# Patient Record
Sex: Male | Born: 1980 | Race: White | Hispanic: No | Marital: Married | Smoking: Never smoker
Health system: Southern US, Community
[De-identification: ages and names within clinical notes are randomized; demographics above are authoritative.]

## PROBLEM LIST (undated history)

## (undated) DIAGNOSIS — I10 Essential (primary) hypertension: Secondary | ICD-10-CM

## (undated) DIAGNOSIS — F419 Anxiety disorder, unspecified: Secondary | ICD-10-CM

## (undated) DIAGNOSIS — H6120 Impacted cerumen, unspecified ear: Secondary | ICD-10-CM

## (undated) HISTORY — DX: Anxiety disorder, unspecified: F41.9

## (undated) HISTORY — DX: Impacted cerumen, unspecified ear: H61.20

---

## 2011-08-19 ENCOUNTER — Ambulatory Visit (INDEPENDENT_AMBULATORY_CARE_PROVIDER_SITE_OTHER): Payer: Self-pay | Admitting: Family Medicine

## 2011-08-19 ENCOUNTER — Encounter: Payer: Self-pay | Admitting: Family Medicine

## 2011-08-19 VITALS — BP 136/84 | HR 74 | Ht 67.75 in | Wt 203.0 lb

## 2011-08-19 DIAGNOSIS — Z0000 Encounter for general adult medical examination without abnormal findings: Secondary | ICD-10-CM | POA: Insufficient documentation

## 2011-08-19 DIAGNOSIS — Z23 Encounter for immunization: Secondary | ICD-10-CM

## 2011-08-19 DIAGNOSIS — E781 Pure hyperglyceridemia: Secondary | ICD-10-CM

## 2011-08-19 LAB — LIPID PANEL
Cholesterol: 235 mg/dL — ABNORMAL HIGH (ref 0–200)
Total CHOL/HDL Ratio: 5
Triglycerides: 114 mg/dL (ref 0.0–149.0)
VLDL: 22.8 mg/dL (ref 0.0–40.0)

## 2011-08-19 LAB — CBC WITH DIFFERENTIAL/PLATELET
Basophils Absolute: 0 10*3/uL (ref 0.0–0.1)
Eosinophils Absolute: 0.1 10*3/uL (ref 0.0–0.7)
Lymphocytes Relative: 32.9 % (ref 12.0–46.0)
Lymphs Abs: 2 10*3/uL (ref 0.7–4.0)
MCHC: 34.4 g/dL (ref 30.0–36.0)
Monocytes Relative: 7.5 % (ref 3.0–12.0)
Platelets: 297 10*3/uL (ref 150.0–400.0)
RDW: 12.9 % (ref 11.5–14.6)

## 2011-08-19 LAB — TSH: TSH: 0.52 u[IU]/mL (ref 0.35–5.50)

## 2011-08-19 MED ORDER — TETANUS-DIPHTH-ACELL PERTUSSIS 5-2.5-18.5 LF-MCG/0.5 IM SUSP: 0.5000 mL | Freq: Once | INTRAMUSCULAR | Status: DC

## 2011-08-19 NOTE — Assessment & Plan Note (Addendum)
Overweight is his primary issue, so we discussed getting back into exercising and start prudent dietary choices--make sustainable changes. We'll recheck lipid panel today (he is fasting this time), plus get TSH and CBC. Discussed routine health issues, vaccinations reviewed--gave TdaP today. Next CPE one year, possible earlier f/u based on lab results.

## 2011-08-19 NOTE — Progress Notes (Signed)
Office Note 08/19/2011  CC:  Chief Complaint  Patient presents with  . Annual Exam    elevated cholesterol    HPI:  Nicholas Compton is a 30 y.o. White male who is here to establish care and get CPE. Patient's most recent primary MD: none Old records were not reviewed prior to or during today's visit.  No acute physical complaints.  Says he had blood work for insurance reasons 06/2011 and his triglycerides were 536, HDL 34, Tchol 280. He was not fasting.  Other labs at that time were normal (complete metabolic panel, HIV, and UA).  Past Medical History  Diagnosis Date  . Elevated blood pressure reading without diagnosis of hypertension     Took low dose of bp med for about 1 yr and weened off when he had 50+ lb wt (purposeful ).  . Cerumen impaction     Recurrent: occasionally goes to ENT to get them cleaned out.  Marland Kitchen Anxiety     Took citalopram (Dr. Drue Second at [facility]) for about 1 yr for GAD and weened off it and has been doing fine since.    History reviewed. No pertinent past surgical history.  Family History  Problem Relation Age of Onset  . Hyperlipidemia Paternal Aunt   . Cancer Maternal Grandmother     breast  . Hyperlipidemia Paternal Grandfather   . Diabetes Paternal Grandfather     History   Social History  . Marital Status: Married    Spouse Name: Drinda Butts    Number of Children: 0  . Years of Education: N/A   Occupational History  . Not on file.   Social History Main Topics  . Smoking status: Never Smoker   . Smokeless tobacco: Never Used  . Alcohol Use: No  . Drug Use: No  . Sexually Active: Yes -- Male partner(s)   Other Topics Concern  . Not on file   Social History Narrative   Married (2 months ago).  No children.Works in his Dispensing optician business, originally from [place], [place].  Went to [employer].  Currently does no formal exercise except occasionally in winter.  Diet is typical american--fast foods too  much, fried/greasy foods.  No T/A/Ds.   MEDS: NONE No outpatient encounter prescriptions on file as of 08/19/2011.   Facility-Administered Encounter Medications as of 08/19/2011  Medication Dose Route Frequency Provider Last Rate Last Dose  . TDaP (BOOSTRIX) injection 0.5 mL  0.5 mL Intramuscular Once Philip H McGowen        No Known Allergies  ROS Review of Systems  Constitutional: Negative for fever, chills, appetite change and fatigue.  HENT: Negative for ear pain, congestion, sore throat, neck stiffness and dental problem.   Eyes: Negative for discharge, redness and visual disturbance.  Respiratory: Negative for cough, chest tightness, shortness of breath and wheezing.   Cardiovascular: Negative for chest pain, palpitations and leg swelling.  Gastrointestinal: Negative for nausea, vomiting, abdominal pain, diarrhea and blood in stool.  Genitourinary: Negative for dysuria, urgency, frequency, hematuria, flank pain and difficulty urinating.  Musculoskeletal: Negative for myalgias, back pain, joint swelling and arthralgias.  Skin: Negative for pallor and rash.  Neurological: Negative for dizziness, speech difficulty, weakness and headaches.  Hematological: Negative for adenopathy. Does not bruise/bleed easily.  Psychiatric/Behavioral: Negative for confusion and sleep disturbance. The patient is not nervous/anxious.     PE; Blood pressure 136/84, pulse 74, height 5' 7.75" (1.721 m), weight 203 lb (92.08 kg), SpO2 98.00%. Gen:  Alert, well appearing.  Patient is oriented to person, place, time, and situation. HEENT: Scalp without lesions or hair loss.  Ears: EACs full of dried yellowish cerumen bilaterally.  Cannot view TMs. Eyes: no injection, icteris, swelling, or exudate.  EOMI, PERRLA. Nose: no drainage or turbinate edema/swelling.  No injection or focal lesion.  Mouth: lips without lesion/swelling.  Oral mucosa pink and moist.  Some dental erosions present, multiple fillings.  Oropharynx without erythema, exudate, or swelling.  Neck: supple, ROM full.  Carotids 2+ bilat, without bruit.  No lymphadenopathy, thyromegaly, or mass. Chest: symmetric expansion, nonlabored respirations.  Clear and equal breath sounds in all lung fields.   CV: RRR, no m/r/g.  Peripheral pulses 2+ and symmetric. ABD: soft, NT, ND, BS normal.  No hepatospenomegaly or mass.  No bruits. EXT: no clubbing, cyanosis, or edema.  Genitals normal; both testes normal without tenderness, masses, hydroceles, varicoceles, erythema or swelling. Shaft normal, uncircumcised, meatus normal without discharge. No inguinal hernia noted. No inguinal lymphadenopathy.   Pertinent labs:  None today  ASSESSMENT AND PLAN:   Health maintenance examination Overweight is his primary issue, so we discussed getting back into exercising and start prudent dietary choices--make sustainable changes. We'll recheck lipid panel today (he is fasting this time), plus get TSH and CBC. Discussed routine health issues, vaccinations reviewed--gave TdaP today. Next CPE one year, possible earlier f/u based on lab results.     Return in about 1 year (around 08/18/2012) for for CPE.

## 2011-08-19 NOTE — Patient Instructions (Signed)
Health Maintenance in Males MAINTAIN REGULAR HEALTH EXAMS  Maintain a healthy diet and normal weight. Increased weight leads to problems with blood pressure and diabetes. Decrease fat in the diet and increase exercise. Obtain a proper diet from your caregiver if necessary.   High blood pressure causes heart and blood vessel problems. Check blood pressures regularly and keep your blood pressure at normal limits. Aerobic exercise helps this. Persistent elevations of blood pressure should be treated with medications if weight loss and exercise are ineffective.   Avoid smoking, drinking in excess (more than 2 drinks per day), or use of street drugs. Do not share needles with anyone. Ask for help if you need assistance or instructions on stopping the use of alcohol, cigarettes, or drugs.   Maintain normal blood lipids and cholesterol. Your caregiver can give you information to lower your risk of heart disease or stroke.   Ask your caregiver if you are in need of early heart disease screening because of a strong family history of heart disease or signs of elevated testosterone (male sex hormone) levels. These can predispose you to early heart disease.   Practice safe sex. Practicing safe sex decreases your risk for a sexually transmitted infection (STI). Some of the STIs are gonorrhea, chlamydia, syphilis, trichimonas, herpes, human papillomavirus (HPV), and human immunodeficiency virus (HIV). Herpes, HIV, and HPV are viral illnesses that have no cure. These can result in disability, cancer, and death.   It is not safe for someone who has AIDS or is HIV positive to have unprotected sex with a partner who is HIV positive. The reason for this is the fact that there are many different strains of HIV. If you have a strain that is readily treated with medications and then suddenly introduce a strain from a partner that has no further treatment options, you may suddenly have a strain of HIV that is untreatable.  Even if you are both positive for HIV, it is still necessary to practice safe sex.   Use sunscreen with a SPF of 15 or greater. Being outside in the sun when your shadow caused by the sun is shorter than you are, means you are being exposed to sun at greater intensity. Lighter skinned people are at a greater risk of skin cancer.   Keep carbon monoxide and smoke detectors in your home and functioning at all times. Change the batteries every 6 months.   Do monthly examinations of your testicles. The best time to do this is after a hot shower or bath when the tissues are loose. Notify your caregivers of any lumps, tenderness, or changes in size or shape.   Notify your caregiver of new moles or changes in moles, especially if there is a change in shape or color. Also notify your caregiver if a mole is larger than the size of a pencil eraser.   Stay current with your tetanus shots and other required immunizations.  The Body Mass Index (BMI) is a way of measuring how much of your body is fat. Having a BMI above 27 increases the risk of heart disease, diabetes, hypertension, stroke, and other problems related to obesity. Document Released: 06/02/2008 Document Re-Released: 05/25/2010 ExitCare Patient Information 2011 [employer]. 

## 2011-08-25 ENCOUNTER — Telehealth: Payer: Self-pay | Admitting: Family Medicine

## 2011-08-25 NOTE — Telephone Encounter (Signed)
Patient's wife didn't call with fax # so patient took the lab results with him

## 2011-08-25 NOTE — Telephone Encounter (Signed)
Labs printed and will be faxed by Diane.

## 2011-08-25 NOTE — Telephone Encounter (Signed)
Patient needs recent labs faxed to [facility], patient is coming by this afternoon to sign a release form, wife will CB with fax #

## 2012-03-08 ENCOUNTER — Telehealth: Payer: Self-pay | Admitting: Family Medicine

## 2012-03-09 NOTE — Telephone Encounter (Signed)
Please advise 

## 2012-03-15 MED ORDER — SCOPOLAMINE 1 MG/3DAYS TD PT72: MEDICATED_PATCH | TRANSDERMAL | Status: DC

## 2012-03-15 NOTE — Telephone Encounter (Signed)
Sent rx to [facility] outpt pharmacy just now.---PM

## 2012-03-19 NOTE — Telephone Encounter (Signed)
Left mess on patient's cell VM

## 2015-04-08 ENCOUNTER — Emergency Department (HOSPITAL_BASED_OUTPATIENT_CLINIC_OR_DEPARTMENT_OTHER)
Admission: EM | Admit: 2015-04-08 | Discharge: 2015-04-08 | Disposition: A | Discharge status: Home / Self Care | Payer: BLUE CROSS/BLUE SHIELD | Attending: Emergency Medicine | Admitting: Emergency Medicine

## 2015-04-08 ENCOUNTER — Encounter (HOSPITAL_BASED_OUTPATIENT_CLINIC_OR_DEPARTMENT_OTHER): Payer: Self-pay | Admitting: *Deleted

## 2015-04-08 ENCOUNTER — Emergency Department (HOSPITAL_BASED_OUTPATIENT_CLINIC_OR_DEPARTMENT_OTHER): Payer: BLUE CROSS/BLUE SHIELD

## 2015-04-08 ENCOUNTER — Other Ambulatory Visit: Payer: Self-pay

## 2015-04-08 DIAGNOSIS — I10 Essential (primary) hypertension: Secondary | ICD-10-CM | POA: Insufficient documentation

## 2015-04-08 DIAGNOSIS — Z791 Long term (current) use of non-steroidal anti-inflammatories (NSAID): Secondary | ICD-10-CM | POA: Diagnosis not present

## 2015-04-08 DIAGNOSIS — R0789 Other chest pain: Secondary | ICD-10-CM | POA: Insufficient documentation

## 2015-04-08 DIAGNOSIS — Z8669 Personal history of other diseases of the nervous system and sense organs: Secondary | ICD-10-CM | POA: Insufficient documentation

## 2015-04-08 DIAGNOSIS — R0600 Dyspnea, unspecified: Secondary | ICD-10-CM | POA: Insufficient documentation

## 2015-04-08 DIAGNOSIS — Z8659 Personal history of other mental and behavioral disorders: Secondary | ICD-10-CM | POA: Diagnosis not present

## 2015-04-08 DIAGNOSIS — R0602 Shortness of breath: Secondary | ICD-10-CM | POA: Diagnosis present

## 2015-04-08 HISTORY — DX: Essential (primary) hypertension: I10

## 2015-04-08 LAB — CBC WITH DIFFERENTIAL/PLATELET
BASOS PCT: 1 % (ref 0–1)
Basophils Absolute: 0 10*3/uL (ref 0.0–0.1)
EOS ABS: 0.1 10*3/uL (ref 0.0–0.7)
Eosinophils Relative: 1 % (ref 0–5)
HEMATOCRIT: 49 % (ref 39.0–52.0)
Hemoglobin: 17.6 g/dL — ABNORMAL HIGH (ref 13.0–17.0)
LYMPHS ABS: 2.4 10*3/uL (ref 0.7–4.0)
Lymphocytes Relative: 30 % (ref 12–46)
MCH: 30.3 pg (ref 26.0–34.0)
MCHC: 35.9 g/dL (ref 30.0–36.0)
MCV: 84.5 fL (ref 78.0–100.0)
MONO ABS: 0.7 10*3/uL (ref 0.1–1.0)
MONOS PCT: 9 % (ref 3–12)
Neutro Abs: 4.6 10*3/uL (ref 1.7–7.7)
Neutrophils Relative %: 59 % (ref 43–77)
Platelets: 316 10*3/uL (ref 150–400)
RBC: 5.8 MIL/uL (ref 4.22–5.81)
RDW: 12.7 % (ref 11.5–15.5)
WBC: 7.9 10*3/uL (ref 4.0–10.5)

## 2015-04-08 LAB — BASIC METABOLIC PANEL
ANION GAP: 8 (ref 5–15)
BUN: 17 mg/dL (ref 6–23)
CALCIUM: 10.1 mg/dL (ref 8.4–10.5)
CHLORIDE: 102 mmol/L (ref 96–112)
CO2: 27 mmol/L (ref 19–32)
Creatinine, Ser: 0.98 mg/dL (ref 0.50–1.35)
GFR calc Af Amer: >90 mL/min (ref >90)
GFR calc non Af Amer: >90 mL/min (ref >90)
Glucose, Bld: 114 mg/dL — ABNORMAL HIGH (ref 70–99)
Potassium: 4 mmol/L (ref 3.5–5.1)
Sodium: 137 mmol/L (ref 135–145)

## 2015-04-08 LAB — TROPONIN I

## 2015-04-08 MED ORDER — NAPROXEN 500 MG PO TABS: 500.0000 mg | ORAL_TABLET | Freq: Two times a day (BID) | ORAL | Status: AC

## 2015-04-08 NOTE — ED Provider Notes (Signed)
CSN: [id number]     Arrival date & time 04/08/15  6295 History   First MD Initiated Contact with Patient 04/08/15 0913     Chief Complaint  Patient presents with  . Shortness of Breath     (Consider location/radiation/quality/duration/timing/severity/associated sxs/prior Treatment) HPI Comments: Patient presents with 5-6 days of intermittent shortness of breath and intermittent sharp migratory chest pains lasting seconds at a time.  He states his felt fatigued.  He occasionally has had tingling in his fingertips and 1-2 episodes of lightheadedness.  He denies fevers, chills, coughing, leg pain or swelling.  No sick contacts.  No known past medical history.  No family history of premature coronary disease.  No smoking, drug use.  No aggravating or alleviating factors.  2.  Shortness of breath or chest pain.  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  Symptoms are not exacerbated by exertion  Patient is a 34 y.o. male presenting with shortness of breath.  Shortness of Breath Associated symptoms: no abdominal pain, no chest pain, no cough, no fever, no headaches, no rash and no vomiting     Past Medical History  Diagnosis Date  . Elevated blood pressure reading without diagnosis of hypertension     Took low dose of bp med for about 1 yr and weened off when he had 50+ lb wt (purposeful ).  . Cerumen impaction     Recurrent: occasionally goes to ENT to get them cleaned out.  Marland Kitchen Anxiety     Took citalopram (Dr. Drue Second at [facility]) for about 1 yr for GAD and weened off it and has been doing fine since.  . Hypertension    History reviewed. No pertinent past surgical history. Family History  Problem Relation Age of Onset  . Hyperlipidemia Paternal Aunt   . Cancer Maternal Grandmother     breast  . Hyperlipidemia Paternal Grandfather   . Diabetes  Paternal Grandfather    History  Substance Use Topics  . Smoking status: Never Smoker   . Smokeless tobacco: Never Used  . Alcohol Use: No    Review of Systems  Constitutional: Negative for fever, activity change, appetite change and fatigue.  HENT: Negative for congestion, facial swelling, rhinorrhea and trouble swallowing.   Eyes: Negative for photophobia and pain.  Respiratory: Positive for chest tightness and shortness of breath. Negative for cough.   Cardiovascular: Negative for chest pain and leg swelling.  Gastrointestinal: Negative for nausea, vomiting, abdominal pain, diarrhea and constipation.  Endocrine: Negative for polydipsia and polyuria.  Genitourinary: Negative for dysuria, urgency, decreased urine volume and difficulty urinating.  Musculoskeletal: Negative for back pain and gait problem.  Skin: Negative for color change, rash and wound.  Allergic/Immunologic: Negative for immunocompromised state.  Neurological: Negative for dizziness, facial asymmetry, speech difficulty, weakness, numbness and headaches.  Psychiatric/Behavioral: Negative for confusion, decreased concentration and agitation.      Allergies  Review of patient's allergies indicates no known allergies.  Home Medications   Prior to Admission medications   Medication Sig Start Date End Date Taking? Authorizing Provider  naproxen (NAPROSYN) 500 MG tablet Take 1 tablet (500 mg total) by mouth 2 (two) times daily with a meal. 04/08/15   Toy Cookey, MD   BP 151/114 mmHg  Pulse 76  Temp(Src) 98.1 F (36.7 C) (Oral)  Resp 20  Ht  (1.778 m)  Wt  210 lb (95.255 kg)  BMI 30.13 kg/m2  SpO2 95% Physical Exam  Constitutional: He is oriented to person, place, and time. He appears well-developed and well-nourished. No distress.  HENT:  Head: Normocephalic and atraumatic.  Mouth/Throat: No oropharyngeal exudate.  Eyes: Pupils are equal, round, and reactive to light.  Neck: Normal range of  motion. Neck supple.  Cardiovascular: Normal rate, regular rhythm and normal heart sounds.  Exam reveals no gallop and no friction rub.   No murmur heard. Pulmonary/Chest: Effort normal and breath sounds normal. No respiratory distress. He has no wheezes. He has no rales.  Abdominal: Soft. Bowel sounds are normal. He exhibits no distension and no mass. There is no tenderness. There is no rebound and no guarding.  Musculoskeletal: Normal range of motion. He exhibits no edema or tenderness.  Neurological: He is alert and oriented to person, place, and time.  Skin: Skin is warm and dry.  Psychiatric: He has a normal mood and affect.    ED Course  Procedures (including critical care time) Labs Review Labs Reviewed  CBC WITH DIFFERENTIAL/PLATELET - Abnormal; Notable for the following:    Hemoglobin 17.6 (*)    All other components within normal limits  BASIC METABOLIC PANEL - Abnormal; Notable for the following:    Glucose, Bld 114 (*)    All other components within normal limits  TROPONIN I    Imaging Review Dg Chest 2 View  04/08/2015   CLINICAL DATA:  Shortness of breath for 3 days.  EXAM: CHEST  2 VIEW  COMPARISON:  None.  FINDINGS: The heart size and mediastinal contours are within normal limits. Both lungs are clear. The visualized skeletal structures are unremarkable.  IMPRESSION: Normal exam.   Electronically Signed   By: Francene BoyersJames  Maxwell M.D.   On: 04/08/2015 08:57     EKG Interpretation   Date/Time:  Wednesday April 08 2015 08:04:32 EDT Ventricular Rate:  85 PR Interval:  136 QRS Duration: 106 QT Interval:  384 QTC Calculation: 456 R Axis:   58 Text Interpretation:  Normal sinus rhythm Normal ECG No prior for  comparison Confirmed by Durinda Buzzelli  MD, Antwuan Eckley ([id number]) on 04/08/2015 8:13:22 AM      MDM   Final diagnoses:  Dyspnea  Atypical chest pain    Pt is a 34 y.o. male with Pmhx as above who presents with her to 6 days of episodic dyspnea and migratory sharp chest  pains.  EKG normal.  Chest x-ray normal.  Troponin is negative. TIMI score is 0, PERC score negative.  Suspect symptoms are musculoskeletal or possibly stress related.  I feel ACS and PE are unlikely.  Patient will be started on scheduled NSAIDs and asked to follow-up with PCP     Selinda MichaelsBrandon D Azam evaluation in the Emergency Department is complete. It has been determined that no acute conditions requiring further emergency intervention are present at this time. The patient/guardian have been advised of the diagnosis and plan. We have discussed signs and symptoms that warrant return to the ED, such as changes or worsening in symptoms, worsening pain, fever, worsening shortness of breath      Toy CookeyMegan Calob Baskette, MD 04/08/15 [phone]

## 2015-04-08 NOTE — ED Notes (Addendum)
C/o sob and chest feels tight. Fingers feel tingly. Onset Saturday. No n/v.

## 2015-04-08 NOTE — Discharge Instructions (Signed)
Chest Pain (Nonspecific) °It is often hard to give a specific diagnosis for the cause of chest pain. There is always a chance that your pain could be related to something serious, such as a heart attack or a blood clot in the lungs. You need to follow up with your health care provider for further evaluation. °CAUSES  °· Heartburn. °· Pneumonia or bronchitis. °· Anxiety or stress. °· Inflammation around your heart (pericarditis) or lung (pleuritis or pleurisy). °· A blood clot in the lung. °· A collapsed lung (pneumothorax). It can develop suddenly on its own (spontaneous pneumothorax) or from trauma to the chest. °· Shingles infection (herpes zoster virus). °The chest wall is composed of bones, muscles, and cartilage. Any of these can be the source of the pain. °· The bones can be bruised by injury. °· The muscles or cartilage can be strained by coughing or overwork. °· The cartilage can be affected by inflammation and become sore (costochondritis). °DIAGNOSIS  °Lab tests or other studies may be needed to find the cause of your pain. Your health care provider may have you take a test called an ambulatory electrocardiogram (ECG). An ECG records your heartbeat patterns over a 24-hour period. You may also have other tests, such as: °· Transthoracic echocardiogram (TTE). During echocardiography, sound waves are used to evaluate how blood flows through your heart. °· Transesophageal echocardiogram (TEE). °· Cardiac monitoring. This allows your health care provider to monitor your heart rate and rhythm in real time. °· Holter monitor. This is a portable device that records your heartbeat and can help diagnose heart arrhythmias. It allows your health care provider to track your heart activity for several days, if needed. °· Stress tests by exercise or by giving medicine that makes the heart beat faster. °TREATMENT  °· Treatment depends on what may be causing your chest pain. Treatment may include: °· Acid blockers for  heartburn. °· Anti-inflammatory medicine. °· Pain medicine for inflammatory conditions. °· Antibiotics if an infection is present. °· You may be advised to change lifestyle habits. This includes stopping smoking and avoiding alcohol, caffeine, and chocolate. °· You may be advised to keep your head raised (elevated) when sleeping. This reduces the chance of acid going backward from your stomach into your esophagus. °Most of the time, nonspecific chest pain will improve within 2-3 days with rest and mild pain medicine.  °HOME CARE INSTRUCTIONS  °· If antibiotics were prescribed, take them as directed. Finish them even if you start to feel better. °· For the next few days, avoid physical activities that bring on chest pain. Continue physical activities as directed. °· Do not use any tobacco products, including cigarettes, chewing tobacco, or electronic cigarettes. °· Avoid drinking alcohol. °· Only take medicine as directed by your health care provider. °· Follow your health care provider's suggestions for further testing if your chest pain does not go away. °· Keep any follow-up appointments you made. If you do not go to an appointment, you could develop lasting (chronic) problems with pain. If there is any problem keeping an appointment, call to reschedule. °SEEK MEDICAL CARE IF:  °· Your chest pain does not go away, even after treatment. °· You have a rash with blisters on your chest. °· You have a fever. °SEEK IMMEDIATE MEDICAL CARE IF:  °· You have increased chest pain or pain that spreads to your arm, neck, jaw, back, or abdomen. °· You have shortness of breath. °· You have an increasing cough, or you cough   up blood. °· You have severe back or abdominal pain. °· You feel nauseous or vomit. °· You have severe weakness. °· You faint. °· You have chills. °This is an emergency. Do not wait to see if the pain will go away. Get medical help at once. Call your local emergency services (911 in [place] Do not drive  yourself to the hospital. °MAKE SURE YOU:  °· Understand these instructions. °· Will watch your condition. °· Will get help right away if you are not doing well or get worse. °Document Released: 09/14/2005 Document Revised: 12/10/2013 Document Reviewed: 07/10/2008 °ExitCare® Patient Information ©2015 [employer]. This information is not intended to replace advice given to you by your health care provider. Make sure you discuss any questions you have with your health care provider. °Shortness of Breath °Shortness of breath means you have trouble breathing. It could also mean that you have a medical problem. You should get immediate medical care for shortness of breath. °CAUSES  °· Not enough oxygen in the air such as with high altitudes or a smoke-filled room. °· Certain lung diseases, infections, or problems. °· Heart disease or conditions, such as angina or heart failure. °· Low red blood cells (anemia). °· Poor physical fitness, which can cause shortness of breath when you exercise. °· Chest or back injuries or stiffness. °· Being overweight. °· Smoking. °· Anxiety, which can make you feel like you are not getting enough air. °DIAGNOSIS  °Serious medical problems can often be found during your physical exam. Tests may also be done to determine why you are having shortness of breath. Tests may include: °· Chest X-rays. °· Lung function tests. °· Blood tests. °· An electrocardiogram (ECG). °· An ambulatory electrocardiogram. An ambulatory ECG records your heartbeat patterns over a 24-hour period. °· Exercise testing. °· A transthoracic echocardiogram (TTE). During echocardiography, sound waves are used to evaluate how blood flows through your heart. °· A transesophageal echocardiogram (TEE). °· Imaging scans. °Your health care provider may not be able to find a cause for your shortness of breath after your exam. In this case, it is important to have a follow-up exam with your health care provider as directed.    °TREATMENT  °Treatment for shortness of breath depends on the cause of your symptoms and can vary greatly. °HOME CARE INSTRUCTIONS  °· Do not smoke. Smoking is a common cause of shortness of breath. If you smoke, ask for help to quit. °· Avoid being around chemicals or things that may bother your breathing, such as paint fumes and dust. °· Rest as needed. Slowly resume your usual activities. °· If medicines were prescribed, take them as directed for the full length of time directed. This includes oxygen and any inhaled medicines. °· Keep all follow-up appointments as directed by your health care provider. °SEEK MEDICAL CARE IF:  °· Your condition does not improve in the time expected. °· You have a hard time doing your normal activities even with rest. °· You have any new symptoms. °SEEK IMMEDIATE MEDICAL CARE IF:  °· Your shortness of breath gets worse. °· You feel light-headed, faint, or develop a cough not controlled with medicines. °· You start coughing up blood. °· You have pain with breathing. °· You have chest pain or pain in your arms, shoulders, or abdomen. °· You have a fever. °· You are unable to walk up stairs or exercise the way you normally do. °MAKE SURE YOU: °· Understand these instructions. °· Will watch your condition. °·   Will get help right away if you are not doing well or get worse. °Document Released: 08/30/2001 Document Revised: 12/10/2013 Document Reviewed: 02/20/2012 °ExitCare® Patient Information ©2015 [employer]. This information is not intended to replace advice given to you by your health care provider. Make sure you discuss any questions you have with your health care provider. ° °

## 2015-04-09 ENCOUNTER — Ambulatory Visit: Payer: BLUE CROSS/BLUE SHIELD | Admitting: Family Medicine

## 2015-04-10 ENCOUNTER — Ambulatory Visit (INDEPENDENT_AMBULATORY_CARE_PROVIDER_SITE_OTHER): Payer: BLUE CROSS/BLUE SHIELD | Admitting: Family Medicine

## 2015-04-10 ENCOUNTER — Encounter: Payer: Self-pay | Admitting: Family Medicine

## 2015-04-10 VITALS — BP 132/84 | HR 80 | Temp 99.0°F | Resp 18 | Ht 68.0 in | Wt 209.0 lb

## 2015-04-10 DIAGNOSIS — R7301 Impaired fasting glucose: Secondary | ICD-10-CM | POA: Insufficient documentation

## 2015-04-10 DIAGNOSIS — E785 Hyperlipidemia, unspecified: Secondary | ICD-10-CM | POA: Insufficient documentation

## 2015-04-10 DIAGNOSIS — R0789 Other chest pain: Secondary | ICD-10-CM | POA: Diagnosis not present

## 2015-04-10 DIAGNOSIS — I10 Essential (primary) hypertension: Secondary | ICD-10-CM | POA: Diagnosis not present

## 2015-04-10 NOTE — Progress Notes (Signed)
Office Note 04/10/2015  CC:  Chief Complaint  Patient presents with  . Establish Care    HPI:  Nicholas Compton is a 34 y.o. White male who is here today to re-establish care.  I saw him once in 2012 and he never followed up.  Says he did not see any other PCP in the interim. Was seen in ED 2 d/a for atypical CP, EKG normal, CXR normal, blood work normal except gluc 114 and Hb 17.6.  BP was 151/114 (pt states recheck prior to d/c home was much improved bp).  He was dx'd with musculoskeletal CP +/- stress and NSAIDs were recommended as well as f/u with me.  He describes having intermittent feeling over the last weeks-months (not clear from pt's report today) of CP that is not brought on by exertion, centrally located and feels like a pressure that extends up into neck but also some intermittent sharp pains across any area of the precordium at times.  Some SOB with most recent episode that prompted the ED visit but this doesn't happen consistently with his CP.  Has some brief periods of lightheadedness as well as brief feeling of tingling in fingertips that don't necessarily happen with the periods of CP.   He was not exactly fasting at the time of his blood draw at the ED--says he had drunk a little bit of coke earlier in the morning.  Denies feeling any persistent feeling of GERD. He has taken the naproxen some since ED visit and says he does feel better. Denies significant problem with stress/anxiety lately.  He admits he is fearful of heart disease, esp since he has heard some "horror stories" of people having normal tests in the ED and later finding out they have significant CAD.  Past Medical History  Diagnosis Date  . Cerumen impaction     Recurrent: occasionally goes to ENT to get them cleaned out.  Marland Kitchen Anxiety     Took citalopram (Dr. Drue Second at [facility]) for about 1 yr for GAD and weened off it and has been doing fine since.  . Hypertension     Took low  dose of bp med for about 1 yr and weened off when he had 50+ lb wt (purposeful ).    No past surgical history on file.  Family History  Problem Relation Age of Onset  . Hyperlipidemia Paternal Aunt   . Cancer Maternal Grandmother     breast  . Hyperlipidemia Paternal Grandfather   . Diabetes Paternal Grandfather     History   Social History  . Marital Status: Married    Spouse Name: Drinda Butts  . Number of Children: 0  . Years of Education: N/A   Occupational History  . Not on file.   Social History Main Topics  . Smoking status: Never Smoker   . Smokeless tobacco: Never Used  . Alcohol Use: No  . Drug Use: No  . Sexual Activity:    Partners: Female   Other Topics Concern  . Not on file   Social History Narrative   Married, has one son.   Works in his [employer] business, originally from [place], [place].  Went to [employer].     Currently does no formal exercise except occasionally in winter.     Diet is typical american--fast foods too much, fried/greasy foods.  No T/A/Ds.    Outpatient Encounter Prescriptions as of 04/10/2015  Medication Sig  . naproxen (NAPROSYN) 500 MG  tablet Take 1 tablet (500 mg total) by mouth 2 (two) times daily with a meal.    No Known Allergies  ROS Review of Systems  Constitutional: Negative for fever and fatigue.  HENT: Negative for congestion and sore throat.   Eyes: Negative for visual disturbance.  Respiratory: Negative for cough.   Gastrointestinal: Negative for nausea and abdominal pain.  Genitourinary: Negative for dysuria.  Musculoskeletal: Negative for back pain and joint swelling.  Skin: Negative for rash.  Neurological: Positive for dizziness (random, brief periods of light headedness, sometimes orthostatic-related). Negative for weakness and headaches.  Hematological: Negative for adenopathy.    PE; Blood pressure 132/84, pulse 80, temperature 99 F (37.2 C), temperature source Temporal, resp. rate 18, height 5\' 8"   (1.727 m), weight 209 lb (94.802 kg), SpO2 99 %. R arm manual cuff: 132/84 after pt in exam room 15 min Gen: Alert, well appearing.  Patient is oriented to person, place, time, and situation. ZOX:WRUEENT:Eyes: no injection, icteris, swelling, or exudate.  EOMI, PERRLA. Mouth: lips without lesion/swelling.  Oral mucosa pink and moist. Oropharynx without erythema, exudate, or swelling.  Neck - No masses or thyromegaly or limitation in range of motion CV: RRR, no m/r/g.  Chest: no chest wall tenderness. LUNGS: CTA bilat, nonlabored resps, good aeration in all lung fields. EXT: no clubbing, cyanosis, or edema.   Pertinent labs:  Lab Results  Component Value Date   CHOL 235* 08/19/2011   HDL 45.10 08/19/2011   LDLDIRECT 177.8 08/19/2011   TRIG 114.0 08/19/2011   CHOLHDL 5 08/19/2011   Lab Results  Component Value Date   WBC 7.9 04/08/2015   HGB 17.6* 04/08/2015   HCT 49.0 04/08/2015   MCV 84.5 04/08/2015   PLT 316 04/08/2015     Chemistry      Component Value Date/Time   NA 137 04/08/2015 0810   K 4.0 04/08/2015 0810   CL 102 04/08/2015 0810   CO2 27 04/08/2015 0810   BUN 17 04/08/2015 0810   CREATININE 0.98 04/08/2015 0810      Component Value Date/Time   CALCIUM 10.1 04/08/2015 0810      ASSESSMENT AND PLAN:   New pt/re-establishing care.  1) Atypical CP; I agree that pt's pain is most likely due to stress/anxiety, possibly some musculoskeletal component and/or GERD/esophagitis component.  I reassured him today that I think the best course of action at this time is to get a few other labs to further risk stratify but no stress testing at this time.  He'll monitor his sx's over the next 1 mo.  2) HTN: I would like more home monitoring data, so he'll check bp and HR daily at home for the next month and we'll review his numbers at f/u in 1 mo.  3) Hyperlipidemia; recheck FLP--he'll make lab appt for fasting labs at his earliest convenience. Will check FLP, CBC, CMET, TSH, and  HbA1c.  4) IFG: if we consider him to have been fasting at his recent ED visit, then he has IFG. Will repeat fasting glucose and will do HbA1c at upcoming fasting lab check.  An After Visit Summary was printed and given to the patient.  Spent 30 min with pt today, with >50% of this time spent in counseling and care coordination regarding the above problems.  Return for 1 month office f/u 30 min; fasting lab visit at earliest convenience .

## 2015-04-10 NOTE — Patient Instructions (Signed)
Take bp and heart rate once a day and write number down and bring in for review at f/u office visit in 1 mo.

## 2015-04-10 NOTE — Progress Notes (Signed)
Pre visit review using our clinic review tool, if applicable. No additional management support is needed unless otherwise documented below in the visit note. 

## 2015-05-07 ENCOUNTER — Ambulatory Visit: Payer: BLUE CROSS/BLUE SHIELD | Admitting: Family Medicine

## 2016-04-10 IMAGING — CR DG CHEST 2V
2 series · 2 of 2 positions shown · non-contrast
Comparison: None.

CLINICAL DATA: Shortness of breath for 3 days.

EXAM:
CHEST  2 VIEW

[w chest pa]
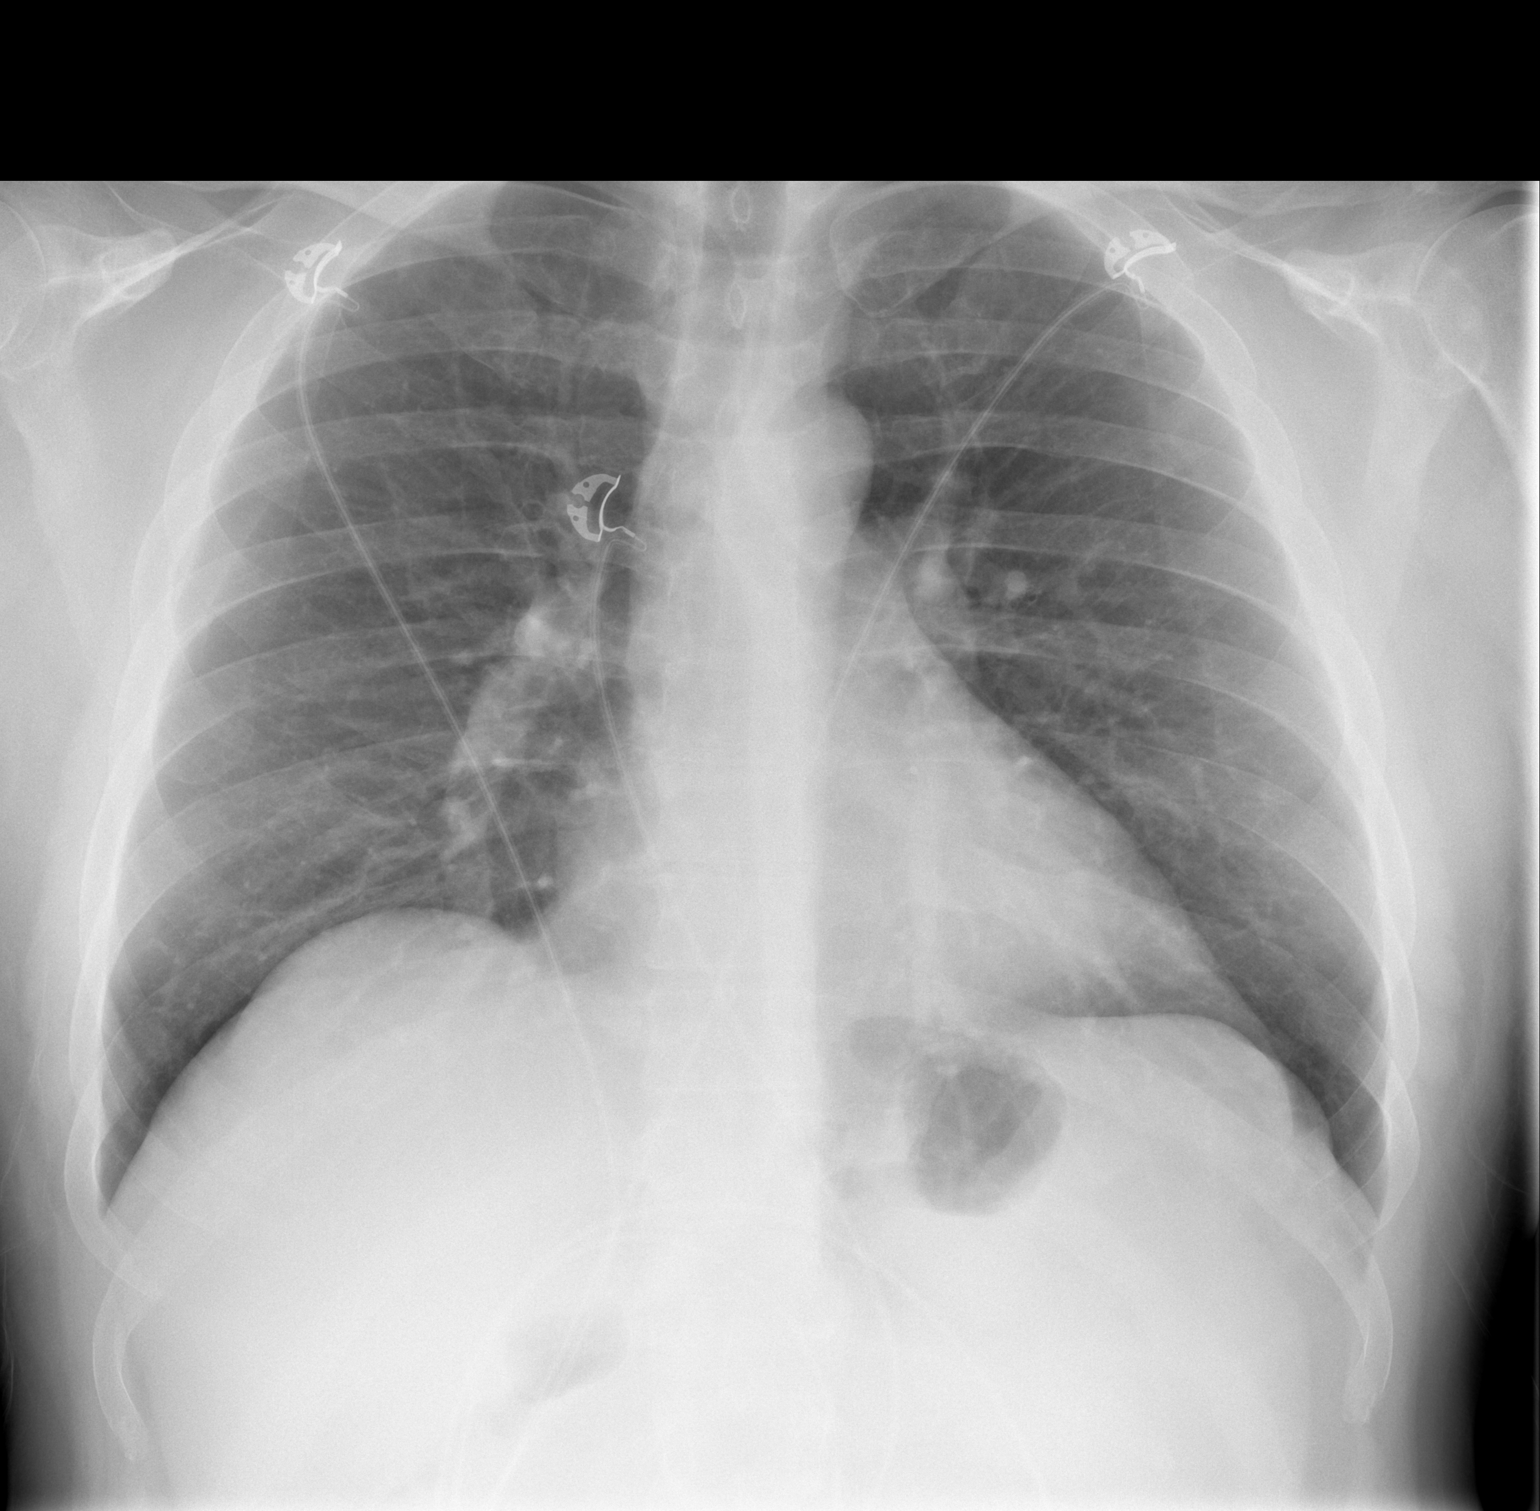

[w chest lat]
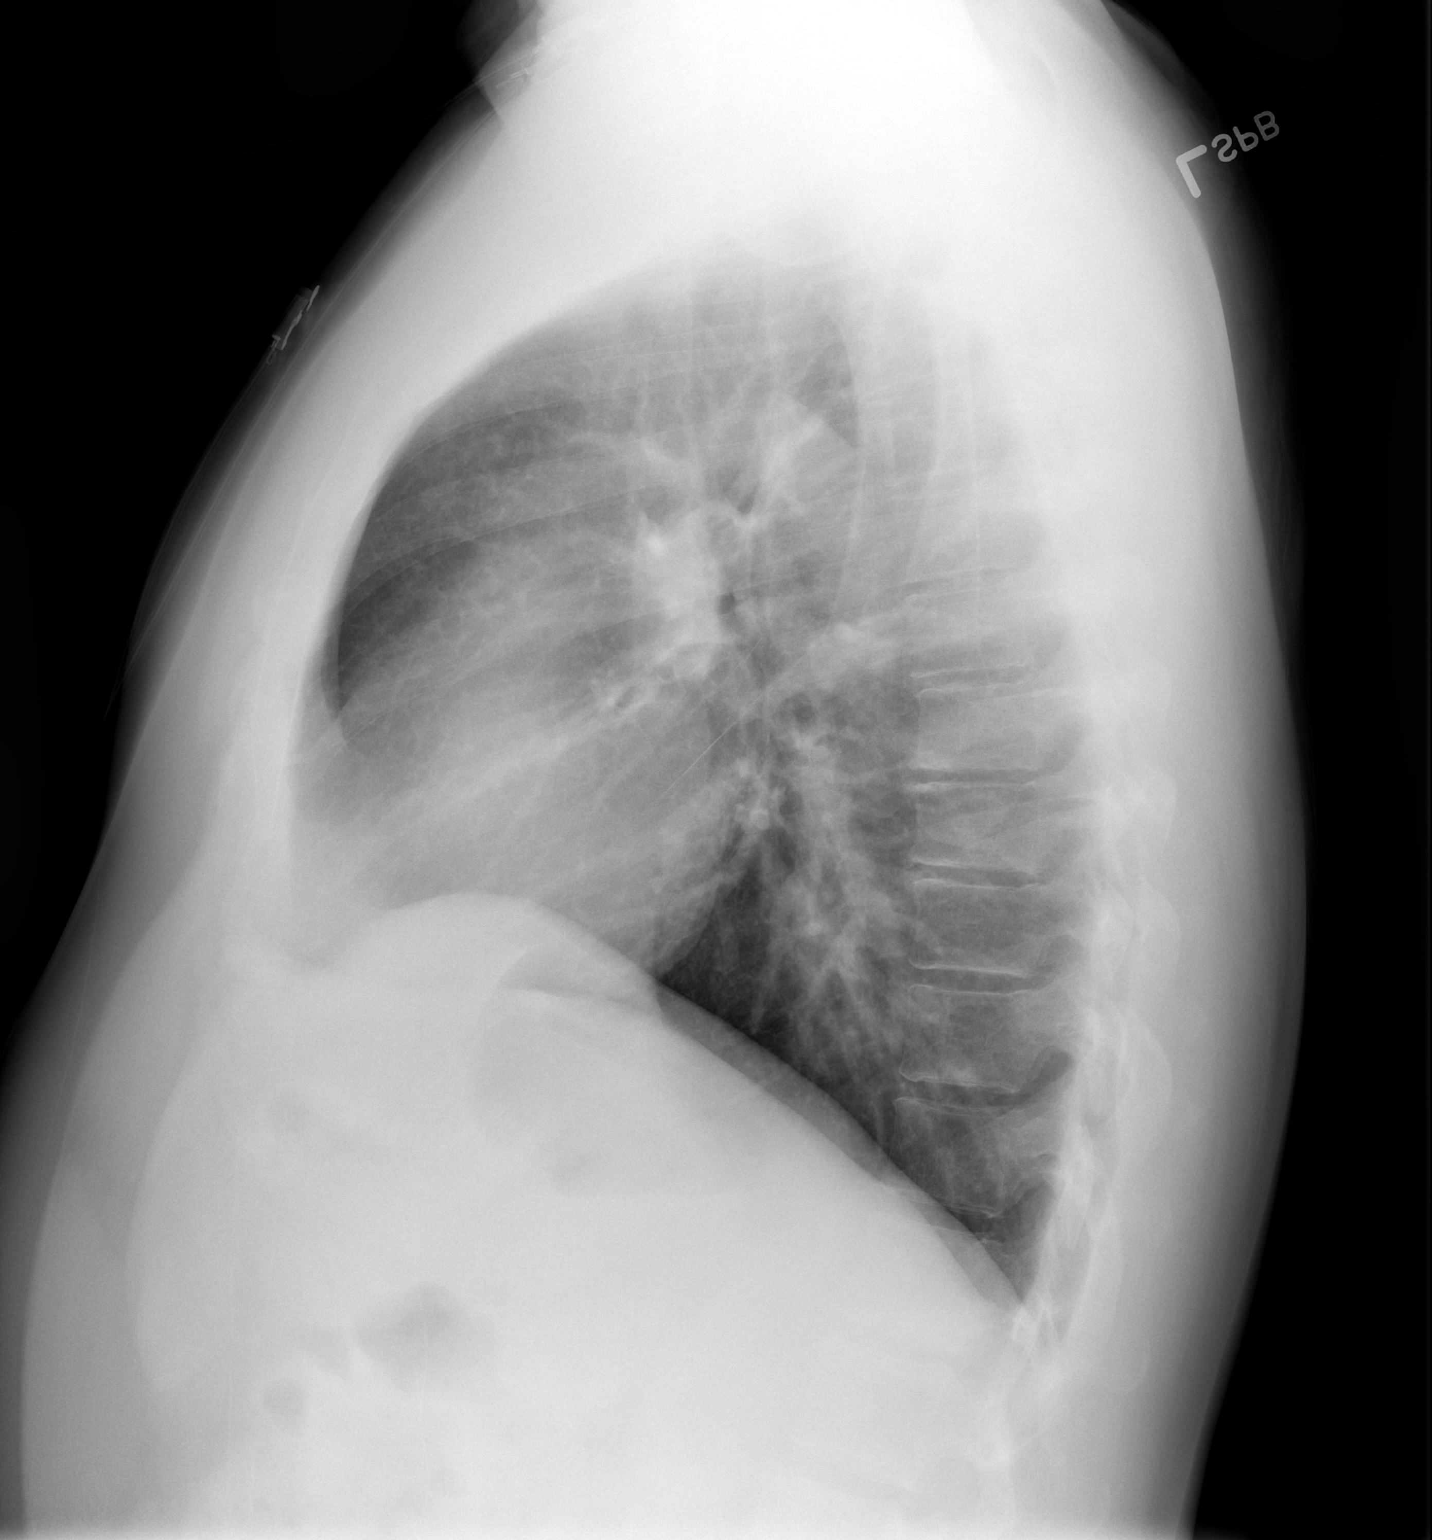

[2 of 2 positions shown; findings below may reference images not displayed]

FINDINGS: The heart size and mediastinal contours are within normal limits.
Both lungs are clear. The visualized skeletal structures are
unremarkable.
IMPRESSION: Normal exam.

## 2022-09-05 DIAGNOSIS — R21 Rash and other nonspecific skin eruption: Secondary | ICD-10-CM | POA: Diagnosis not present

## 2022-12-27 DIAGNOSIS — R69 Illness, unspecified: Secondary | ICD-10-CM | POA: Diagnosis not present

## 2022-12-27 DIAGNOSIS — I10 Essential (primary) hypertension: Secondary | ICD-10-CM | POA: Diagnosis not present

## 2023-07-04 DIAGNOSIS — F418 Other specified anxiety disorders: Secondary | ICD-10-CM | POA: Diagnosis not present

## 2023-07-04 DIAGNOSIS — I10 Essential (primary) hypertension: Secondary | ICD-10-CM | POA: Diagnosis not present
